# Patient Record
Sex: Female | Born: 1935 | Race: White | Hispanic: No | Smoking: Never smoker
Health system: Southern US, Community
[De-identification: ages and names within clinical notes are randomized; demographics above are authoritative.]

## PROBLEM LIST (undated history)

## (undated) DIAGNOSIS — H539 Unspecified visual disturbance: Secondary | ICD-10-CM

## (undated) DIAGNOSIS — R140 Abdominal distension (gaseous): Secondary | ICD-10-CM

## (undated) DIAGNOSIS — I10 Essential (primary) hypertension: Secondary | ICD-10-CM

## (undated) HISTORY — DX: Abdominal distension (gaseous): R14.0

## (undated) HISTORY — DX: Unspecified visual disturbance: H53.9

## (undated) HISTORY — DX: Essential (primary) hypertension: I10

---
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
## 1998-12-05 ENCOUNTER — Other Ambulatory Visit: Admission: RE | Admit: 1998-12-05 | Discharge: 1998-12-05 | Payer: Self-pay | Admitting: Family Medicine

## 1999-10-31 ENCOUNTER — Other Ambulatory Visit: Admission: RE | Admit: 1999-10-31 | Discharge: 1999-10-31 | Payer: Self-pay | Admitting: Family Medicine

## 2000-10-20 HISTORY — PX: OTHER SURGICAL HISTORY: SHX169

## 2003-10-21 HISTORY — PX: OTHER SURGICAL HISTORY: SHX169

## 2007-02-09 ENCOUNTER — Encounter (INDEPENDENT_AMBULATORY_CARE_PROVIDER_SITE_OTHER): Payer: Self-pay | Admitting: *Deleted

## 2007-02-09 ENCOUNTER — Ambulatory Visit (HOSPITAL_BASED_OUTPATIENT_CLINIC_OR_DEPARTMENT_OTHER): Admission: RE | Admit: 2007-02-09 | Discharge: 2007-02-09 | Payer: Self-pay | Admitting: Surgery

## 2009-01-30 ENCOUNTER — Ambulatory Visit (HOSPITAL_BASED_OUTPATIENT_CLINIC_OR_DEPARTMENT_OTHER): Admission: RE | Admit: 2009-01-30 | Discharge: 2009-01-30 | Payer: Self-pay | Admitting: Family Medicine

## 2009-02-03 ENCOUNTER — Ambulatory Visit: Payer: Self-pay | Admitting: Internal Medicine

## 2009-02-28 ENCOUNTER — Ambulatory Visit (HOSPITAL_BASED_OUTPATIENT_CLINIC_OR_DEPARTMENT_OTHER): Admission: RE | Admit: 2009-02-28 | Discharge: 2009-02-28 | Payer: Self-pay | Admitting: Family Medicine

## 2009-03-03 ENCOUNTER — Ambulatory Visit: Payer: Self-pay | Admitting: Internal Medicine

## 2011-03-07 NOTE — Op Note (Signed)
Alicia Hale, Alicia Hale                 ACCOUNT NO.:  [id number]   MEDICAL RECORD NO.:  [id number]          PATIENT TYPE:  AMB   LOCATION:  [facility]                         FACILITY:  [facility]   PHYSICIAN:  Ardeth Sportsman, MD     DATE OF BIRTH:  Mar 15, 1936   DATE OF PROCEDURE:  02/09/2007  DATE OF DISCHARGE:                               OPERATIVE REPORT   PRIMARY CARE PHYSICIAN:  Windle Guard, M.D.   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   OPERATION PERFORMED:  Wide excision and primary two layered closure of  mass.  Final wound was 3.5 x 7 cm in size.   SURGEON:  Ardeth Sportsman, MD   ASSISTANT:  None.   ANESTHESIA:  1. Monitored deep sedation.  2. 0.25% bupivacaine in a field block.  0.25% bupivacaine with      epinephrine in a field block around the incision.   SPECIMENS:  Midback lesion with squamous cell carcinoma in situ, short  stitch superior, long stitch left lateral.   DRAINS:  None.   ESTIMATED BLOOD LOSS:  Less than 5 mm.   COMPLICATIONS:  None apparent.   INDICATIONS FOR PROCEDURE:  Ms. Franko is a 75 year old female who was  found to have an incidental concerning skin lesion that by punch biopsy  came back as squamous cell carcinoma in situ or Bowen's disease.  Options discussed and recommendation was made for wide excision and  primary closure.  Given the large size, 2 x 2 cm size, recommended this  be done in the operative setting under a more controlled setting and  more careful sedation.  Technique discussed.  Risks such as stroke,  heart attack, deep venous thrombosis, pulmonary embolus and death  discussed.  The risks such as bleeding, need for transfusion, wound  infection, abscess, need for re-excision for positive margins, wound  dehiscence and other  risks were discussed.  Questions answered and she  and her husband agreed to proceed.   OPERATIVE FINDINGS:  The lesion seemed to be well circumscribed in the  dermis only with no evidence of any deep layer involvement.  Felt like I  had good margins.   DESCRIPTION OF PROCEDURE:  Informed consent was confirmed.  The patient  received IV cefazolin just prior to induction.  She had sequential  compression devices active during the entire case.  She was positioned  left side down.  Her back was prepped and draped in sterile fashion.  The area was marked to have about a 2 mm margin on either side and a  field block was placed.  Vertical elliptical incision was made given the  fact that this lesion was right in the midline and there was too much  tension to allow a transverse excision and closure.  I was able to get  around that lesion using scalpel through into the subcutaneous tissues.  Cautery was used to help free the mass off the subcutaneous tissues of  back.  Hemostasis was assured.  Skin flaps were created about 2 cm  laterally on either side & circumferentially.  The wound was closed  using 3-0 Vicryl interrupted deep dermal stitches and 4-0 Monocryl  subcutaneous stitch and full length half inch wide Steri-Strips.  Sterile dressing was applied.  The patient tolerated the procedure well.   Patient was sent to recovery room in stable condition.  I explained the  operative findings to the patient's husband and discussed postoperative  management with both the patient and her husband prior to surgery and  then again with her husband after surgery.  Questions answered and they  expressed understanding and appreciation.   Will have the patient follow up in about seven to 10 days for a final  wound check and follow-up of pathology.      Ardeth Sportsman, MD  Electronically Signed     SCG/MEDQ  D:  02/09/2007  T:  02/09/2007  Job:  [id number]   cc:   Windle Guard, M.D.  Fax:  [phone]

## 2011-03-07 NOTE — Procedures (Signed)
NAME:  Alicia Hale, Alicia Hale                 ACCOUNT NO.:  [id number]   MEDICAL RECORD NO.:  [id number]          PATIENT TYPE:  OUT   LOCATION:  [facility] CENTER                 FACILITY:  [facility]   PHYSICIAN:  Clinton D. Maple Hudson, MD, FCCP, FACPDATE OF BIRTH:  Jan [birth year] 1938   DATE OF STUDY:                            NOCTURNAL POLYSOMNOGRAM   REFERRING PHYSICIAN:  Windle Guard, M.D.   INDICATION FOR STUDY:  Insomnia with sleep apnea.   EPWORTH SLEEPINESS SCORE:  Epworth sleepiness score 4/24, BMI 29.3,  weight 160 pounds, height 62 inches, and neck 12.5 inches.   HOME MEDICATIONS:  Charted and reviewed.   SLEEP ARCHITECTURE:  Total sleep time 334 minutes with sleep efficiency  83.5%.  Stage I was 10.5%, stage II 66.2%, stage III absent, REM 23.3%  of total sleep time.  Sleep latency 12.5 minutes, REM latency 77  minutes, awake after sleep onset 53.5 minutes, arousal index 43.9  indicating increased EEG arousals.  No bedtime medication was taken.   RESPIRATORY DATA:  Apnea/hypopnea index (AHI) 9.5 per hour.  Total of 53  events were counted including 23 obstructive apneas and 30 hypopneas.  Events were more common while supine, but seen in all sleep positions.  REM AHI 9.2 per hour.  An additional 166 respiratory events related to  arousal were counted.  These events did not meet duration and  desaturation criteria for scoring as standard apneas and hypopneas were  seen to result in sleep disturbance and contributed to an overall  respiratory disturbance index (RDI) of 39.3 per hour.  There were  insufficient apneas and hypopneas to qualify for CPAP titration by split  protocol on the study night.   OXYGEN DATA:  Moderate snoring with oxygen desaturation to a nadir of  88%.  Mean oxygen saturation on room air throughout the study was 93.4%.   CARDIAC DATA:  Sinus rhythm with occasional PAC.   MOVEMENTS/PARASOMNIA:  No significant movement disturbance.  Bathroom  x1.   IMPRESSION/RECOMMENDATIONS:  1. Mild-to-moderate obstructive sleep apnea/hypopnea syndrome.  AHI      9.5 per hour.  By water inclusion of mild events with sleep      disturbance, overall respiratory disturbance index was 39.3,      qualifying as moderate obstructive sleep apnea/hypopnea syndrome.      Events were somewhat more common while supine, but nonpositional.      Moderate snoring with oxygen desaturation to a nadir of 88%.  2. There were insufficient full apneas and hypopneas to meet criteria      for CPAP titration by split protocol on the      study night.  Overall, RDI is sufficient to consider return for      CPAP titration if clinically appropriate, otherwise consider      alternative management.      Clinton D. Maple Hudson, MD, [facility], FACP  Diplomate, Biomedical engineer of Sleep Medicine  Electronically Signed     CDY/MEDQ  D:  02/03/2009 11:18:09  T:  02/03/2009 22:23:30  Job:  [id number]

## 2011-03-07 NOTE — Procedures (Signed)
NAME:  Alicia Hale, SCALISI                 ACCOUNT NO.:  [id number]   MEDICAL RECORD NO.:  [id number]          PATIENT TYPE:  OUT   LOCATION:  SLEEP CENTER                 [facility]ACILITY[facility]   PHYSICIAN:  Clinton D. Maple Hudson, MD, FCCP, FACPDATE OF BIRTH:  [birth year]   DATE OF STUDY:                            NOCTURNAL POLYSOMNOGRAM   REFERRING PHYSICIAN:  Windle Guard, M.D.   INDICATION FOR STUDY:  Hypersomnia with sleep apnea.   EPWORTH SLEEPINESS SCORE:  4/24.  BMI 29.3.  Weight 160 pounds.  Height  62 inches.  Neck 12.5 inches.   MEDICATIONS:  Home medications charted and reviewed.  A previous  diagnostic NPSG on January 30, 2009 recorded an AHI of 9.5 an RDI of 39.3  per hour.  CPAP titration was requested.   SLEEP ARCHITECTURE:  Total sleep time 266 minutes with sleep efficiency  66.3%.  Stage I was 3.2%.  Stage II 83.3%.  Stage III absent.  REM 13.5%  of total sleep time.  Sleep latency 22.5 minutes.  REM latency 65  minutes.  Awake after sleep onset 107 minutes.  Arousal index 9.9.  No  bedtime medication was reported.   RESPIRATORY DATA:  CPAP titration protocol.  CPAP was titrated to 11  CWP, AHI 1 per hour.  She wore a small [employer] Respironics FitLife mask  with heated humidifier.   OXYGEN DATA:  Moderate snoring before CPAP control, prevented by CPAP  with mean oxygen saturation on CPAP 95.8% on room air.   CARDIAC DATA:  Normal sinus rhythm.   MOVEMENT-PARASOMNIA:  Frequent limb jerks with a total of 94 counted of  which 30 were associated with arousal or awakening for periodic limb  movement with arousal index of 6.8 per hour.   IMPRESSIONS-RECOMMENDATIONS:  1. Successful CPAP titration to 11 CWP, AHI 1 per hour.  She wore a      small [employer]ips Respironics FitLife mask with heated humidifier.  2. Baseline diagnostic NPSG on January 30, 2009 recorded an AHI of 9.5      and RDI of 39.3 per hour.  3. Periodic limb movement with arousal was noted during CPAP titration      at  an index of 6.8 arousals per hour.  This may be an artifact of      the disturbance associated with CPAP titration.  If limb movement      persists as a      significant cause of sleep disturbance at home after she is      adjusted to CPAP then specific therapy such as Requip or Mirapex      might be considered if appropriate.      Clinton D. Maple Hudson, MD, [facility], FACP  Diplomate, Biomedical engineer of Sleep Medicine  Electronically Signed     CDY/MEDQ  D:  03/03/2009 15:06:49  T:  03/04/2009 07:33:13  Job:  [id number]

## 2011-09-03 ENCOUNTER — Ambulatory Visit (INDEPENDENT_AMBULATORY_CARE_PROVIDER_SITE_OTHER): Payer: Medicare Other | Admitting: Surgery

## 2011-09-03 ENCOUNTER — Encounter (INDEPENDENT_AMBULATORY_CARE_PROVIDER_SITE_OTHER): Payer: Self-pay | Admitting: Surgery

## 2011-09-03 VITALS — BP 118/62 | HR 60 | Temp 98.3°F | Resp 14 | Ht 62.0 in | Wt 163.2 lb

## 2011-09-03 DIAGNOSIS — K8010 Calculus of gallbladder with chronic cholecystitis without obstruction: Secondary | ICD-10-CM

## 2011-09-03 NOTE — Patient Instructions (Signed)
Gallbladder Disease Gallbladder disease (cholecystitis) is an inflammation of your gallbladder. It is usually caused by a build-up of stones (gallstones) or sludge (cholelithiasis) in your gallbladder. The gallbladder is not an essential organ. It is located slightly to the right of center in the belly (abdomen), behind the liver. It stores bile made in the liver. Bile aids in digestion of fats. Gallbladder disease may result in nausea (feeling sick to your stomach), abdominal pain, and jaundice. In severe cases, emergency surgery may be required. The most common type of gallbladder disease is gallstones. They begin as small crystals and slowly grow into stones. Gallstone pain occurs when the bile duct has spasms. The spasms are caused by the stone passing out of the duct. The stone is trying to pass at the same time bile is passing into the small bowel for digestion. The pain usually begins suddenly. It may persist from several minutes to several hours. Infection can occur. Infection can add to discomfort and severity of an acute attack. The pain may be made worse by breathing deeply or by being jarred. There may be fever and tenderness to the touch. In some cases, when gallstones do not move into the bile duct, people have no pain or symptoms. These are called "silent" gallstones. Women are three times more likely to develop gallstones than men. Women who have had several pregnancies are more likely to have gallbladder disease. Physicians sometimes advise removing diseased gallbladders before future pregnancies. Other factors that increase the risk of gallbladder disease are obesity, diets heavy in fried foods and dairy products, increasing age, prolonged use of medications containing female hormones, and heredity. HOME CARE INSTRUCTIONS   If your physician prescribed an antibiotic, take as directed.   Only take over-the-counter or prescription medicines for pain, discomfort, or fever as directed by your  caregiver.   Follow a low fat diet until seen again. (Fat causes the gallbladder to contract.)   Follow-up as instructed. Attacks are almost always recurrent and surgery is usually required for permanent treatment.  SEEK IMMEDIATE MEDICAL CARE IF:   Pain is increasing and not controlled by medications.   The pain moves to another part of your abdomen or to your back. (Right sided pain can be appendicitis and left sided pain in adults can be diverticulitis).   You have a fever.   You develop nausea and vomiting.  Document Released: 10/06/2005 Document Revised: 06/18/2011 Document Reviewed: 05/25/2008 [employer] Patient Information 2012 [employer]. 

## 2011-09-03 NOTE — Progress Notes (Signed)
Subjective:     Patient ID: Alicia Hale, female   DOB: 10-23-1935, 75 y.o.   MRN: [id number]  HPI  Patient Care Team: Kaleen Mask as PCP - General (Family Medicine)  This patient is a 75 y.o.female who presents today for surgical evaluation.   Reason for consult: Abdominal pain and gallstones.  Patient is a pleasant obese female who has a history of chronic epigastric discomfort improved on Nexium. She's never had an endoscopy. She denies true reflux with this. She compresses when she tried to wean herself off and Nexium, she Nu-Knit take a lot of TUMS. She has palpable about every day or so. Never had a colonoscopy. Guaiac cards always been negative.  Her main complaint is after eating a funnel cake, she had an episode of severe upper abdominal pain. She had nausea and vomiting. She had severe bloating. She had never had pain like this before.  It lasted for a few hours and went away. She presented to her primary care physician. Workup showed gallstones. Dr. Jeannetta Nap sent the patient to me for surgical evaluation.  Patient often goes to the mall and can walk there for several hours without difficulty. No history of strokes or heart attacks.  Past Medical History  Diagnosis Date  . Asthma   . Hypertension   . Visual disturbance     due to early stage macular degeneration  . Abdominal distension     Past Surgical History  Procedure Date  . Appendectomy 1942  . Arthroscopic surgery 2005    left knee - torn cartilage  . Pre cancerous growth removal 2002    from mid lower back    History   Social History  . Marital Status: Widowed    Spouse Name: N/A    Number of Children: N/A  . Years of Education: N/A   Occupational History  . Not on file.   Social History Main Topics  . Smoking status: Never Smoker   . Smokeless tobacco: Never Used  . Alcohol Use: Yes     rarely - 2 or 3 times per year  . Drug Use: No  . Sexually Active: Not on file   Other Topics Concern   . Not on file   Social History Narrative  . No narrative on file    Family History  Problem Relation Age of Onset  . Other Mother 93    fluid in lungs  . Stroke Father 64    Current outpatient prescriptions:esomeprazole (NEXIUM) 20 MG capsule, Take 20 mg by mouth daily before breakfast.  , Disp: , Rfl: ;  Multiple Vitamins-Minerals (ONE-A-DAY 50 PLUS PO), Take by mouth daily.  , Disp: , Rfl: ;  Multiple Vitamins-Minerals (PRESERVISION AREDS PO), Take by mouth 2 (two) times daily.  , Disp: , Rfl:  triamterene-hydrochlorothiazide (MAXZIDE) 75-50 MG per tablet, Take 1 tablet by mouth daily. Patient takes 1/2 a tablet daily. , Disp: , Rfl:   Allergies  Allergen Reactions  . Amoxicillin Hives    Hives from the neck down, will turn face red.  . Oxycodone Nausea And Vomiting    Pt had intolerance to narcotic after knee surgery...       Review of Systems  Constitutional: Negative for fever, chills, diaphoresis, appetite change and fatigue.  HENT: Negative for ear pain, sore throat, trouble swallowing, neck pain and ear discharge.   Eyes: Negative for photophobia, discharge and visual disturbance.  Respiratory: Negative for cough, choking, chest tightness and shortness of  breath.   Cardiovascular: Negative for chest pain and palpitations.  Gastrointestinal: Negative for diarrhea, constipation, blood in stool, anal bleeding and rectal pain.       No personal nor family history of GI/colon cancer, inflammatory bowel disease, irritable bowel syndrome, allergy such as Celiac Sprue, dietary/dairy problems, colitis, ulcers nor gastritis.    No recent sick contacts/gastroenteritis.  No travel outside the country.  No changes in diet.    Genitourinary: Negative for dysuria, frequency and difficulty urinating.  Musculoskeletal: Negative for myalgias and gait problem.  Skin: Negative for color change, pallor and rash.  Neurological: Negative for dizziness, speech difficulty, weakness and  numbness.  Hematological: Negative for adenopathy.  Psychiatric/Behavioral: Negative for confusion and agitation. The patient is not nervous/anxious.        Objective:   Physical Exam  Constitutional: She is oriented to person, place, and time. She appears well-developed and well-nourished. No distress.  HENT:  Head: Normocephalic.  Mouth/Throat: Oropharynx is clear and moist. No oropharyngeal exudate.  Eyes: Conjunctivae and EOM are normal. Pupils are equal, round, and reactive to light. No scleral icterus.  Neck: Normal range of motion. Neck supple. No tracheal deviation present.  Cardiovascular: Normal rate, regular rhythm and intact distal pulses.   Pulmonary/Chest: Effort normal and breath sounds normal. No respiratory distress. She exhibits no tenderness.  Abdominal: Soft. She exhibits no distension and no mass. There is no tenderness. Hernia confirmed negative in the right inguinal area and confirmed negative in the left inguinal area.       Obese, RLQ vertical scar c/w appy as child.  No hernias  Genitourinary: No vaginal discharge found.  Musculoskeletal: Normal range of motion. She exhibits no tenderness.  Lymphadenopathy:    She has no cervical adenopathy.       Right: No inguinal adenopathy present.       Left: No inguinal adenopathy present.  Neurological: She is alert and oriented to person, place, and time. No cranial nerve deficit. She exhibits normal muscle tone. Coordination normal.  Skin: Skin is warm and dry. No rash noted. She is not diaphoretic. No erythema.  Psychiatric: She has a normal mood and affect. Her behavior is normal. Judgment and thought content normal.   Studies:  U/S shows gallbladder full of gallstones     Assessment:     Symptomatic gallstones, prob chronic cholecystitis    Plan:     I think it is reasonable to start with a single site approach. If her symptoms do not improve, consider upper and lower endoscopy. Screening colonoscopy per her  primary care physician.  The anatomy & physiology of hepatobiliary & pancreatic function was discussed.  The pathophysiology of gallbladder dysfunction was discussed.  Natural history risks without surgery was discussed.   I feel the risks of no intervention will lead to serious problems that outweigh the operative risks; therefore, I recommended cholecystectomy to remove the pathology.  I explained laparoscopic techniques with possible need for an open approach.  Probable cholangiogram to evaluate the bilary tract was explained as well.    Risks such as bleeding, infection, abscess, leak, injury to other organs, need for further treatment, heart attack, death, and other risks were discussed.  Possibility that this will not correct all abdominal symptoms was explained.  Goals of post-operative recovery were discussed as well.  We will work to minimize complications.  An educational handout further explaining the pathology and treatment options was given as well.  Questions were answered.  The patient expresses  understanding & wishes to proceed with surgery.

## 2011-10-17 ENCOUNTER — Encounter (INDEPENDENT_AMBULATORY_CARE_PROVIDER_SITE_OTHER): Payer: Self-pay | Admitting: Family Medicine

## 2011-11-07 ENCOUNTER — Other Ambulatory Visit (INDEPENDENT_AMBULATORY_CARE_PROVIDER_SITE_OTHER): Payer: Self-pay | Admitting: Surgery

## 2011-11-07 DIAGNOSIS — K8010 Calculus of gallbladder with chronic cholecystitis without obstruction: Secondary | ICD-10-CM

## 2011-11-07 HISTORY — PX: CHOLECYSTECTOMY: SHX55

## 2011-11-08 ENCOUNTER — Telehealth (INDEPENDENT_AMBULATORY_CARE_PROVIDER_SITE_OTHER): Payer: Self-pay | Admitting: General Surgery

## 2011-11-08 NOTE — Telephone Encounter (Signed)
Alicia Hale had a lap chole by Dr. Michaell Cowing on 1/18.  She awoke this AM with a rash on her body.  She has been taking Tylenol for pain.  Her daughter-in-law, Nabila Albarracin, wondered if the rash was from an antibiotic she was given at the time of her surgery and I told her there was a good chance that this was the case.  I instructed her to give Alicia Hale some Benadryl and if the rashed worsened, she had difficulty breathing or swallowing then they should go to the emergency room.

## 2011-12-03 ENCOUNTER — Encounter (INDEPENDENT_AMBULATORY_CARE_PROVIDER_SITE_OTHER): Payer: Self-pay | Admitting: Surgery

## 2011-12-03 ENCOUNTER — Ambulatory Visit (INDEPENDENT_AMBULATORY_CARE_PROVIDER_SITE_OTHER): Payer: Medicare Other | Admitting: Surgery

## 2011-12-03 VITALS — BP 126/80 | HR 70 | Temp 97.9°F | Resp 16 | Ht 62.0 in | Wt 162.8 lb

## 2011-12-03 DIAGNOSIS — K8010 Calculus of gallbladder with chronic cholecystitis without obstruction: Secondary | ICD-10-CM

## 2011-12-03 NOTE — Progress Notes (Signed)
Subjective:     Patient ID: Alicia Hale, female   DOB: 15-May-1936, 76 y.o.   MRN: [id number]  HPI  Alicia Hale  [birth year] [id number]  Patient Care Team: Kaleen Mask, MD as PCP - General (Family Medicine)  This patient is a 76 y.o.female who presents today for surgical evaluation.   Procedure: Single site a laparoscopic cholecystectomy and cholangiogram 11/07/2011  Pathology: Chronic cholecystitis and Colace cystoscopy lithiasis  The patient comes in today feeling well. No more soreness. Wanting to be more active. Mild constipation controlled with the p.r.n. prunes. No fevers or chills. No jaundice. Urine light yellow no dark urine or light stools. Energy level good.  Patient Active Problem List  Diagnoses  . Cholelithiasis with cholecystitis    Past Medical History  Diagnosis Date  . Asthma   . Hypertension   . Visual disturbance     due to early stage macular degeneration  . Abdominal distension     Past Surgical History  Procedure Date  . Appendectomy 1942  . Arthroscopic surgery 2005    left knee - torn cartilage  . Pre cancerous growth removal 2002    from mid lower back  . Cholecystectomy 11/07/11    History   Social History  . Marital Status: Widowed    Spouse Name: N/A    Number of Children: N/A  . Years of Education: N/A   Occupational History  . Not on file.   Social History Main Topics  . Smoking status: Never Smoker   . Smokeless tobacco: Never Used  . Alcohol Use: Yes     rarely - 2 or 3 times per year  . Drug Use: No  . Sexually Active: Not on file   Other Topics Concern  . Not on file   Social History Narrative  . No narrative on file    Family History  Problem Relation Age of Onset  . Other Mother 93    fluid in lungs  . Stroke Father 93    Current outpatient prescriptions:esomeprazole (NEXIUM) 20 MG capsule, Take 20 mg by mouth daily before breakfast.  , Disp: , Rfl: ;  Multiple Vitamins-Minerals (ONE-A-DAY 50 PLUS  PO), Take by mouth daily.  , Disp: , Rfl: ;  Multiple Vitamins-Minerals (PRESERVISION AREDS PO), Take by mouth 2 (two) times daily.  , Disp: , Rfl:  triamterene-hydrochlorothiazide (MAXZIDE) 75-50 MG per tablet, Take 1 tablet by mouth daily. Patient takes 1/2 a tablet daily. , Disp: , Rfl:   Allergies  Allergen Reactions  . Amoxicillin Hives    Hives from the neck down, will turn face red.  . Oxycodone Rash    All over the body    BP 126/80  Pulse 70  Temp(Src) 97.9 F (36.6 C) (Temporal)  Resp 16  Ht 5\' 2"  (1.575 m)  Wt 162 lb 12.8 oz (73.846 kg)  BMI 29.78 kg/m2     Review of Systems  Constitutional: Negative for fever, chills and diaphoresis.  HENT: Negative for ear pain, sore throat and trouble swallowing.   Eyes: Negative for photophobia and visual disturbance.  Respiratory: Negative for cough and choking.   Cardiovascular: Negative for chest pain and palpitations.  Gastrointestinal: Negative for nausea, vomiting, abdominal pain, diarrhea, constipation, anal bleeding and rectal pain.  Genitourinary: Negative for dysuria, frequency and difficulty urinating.  Musculoskeletal: Negative for myalgias and gait problem.  Skin: Negative for color change, pallor and rash.  Neurological: Negative for dizziness, speech difficulty, weakness and numbness.  Hematological: Negative for adenopathy.  Psychiatric/Behavioral: Negative for confusion and agitation. The patient is not nervous/anxious.        Objective:   Physical Exam  Constitutional: She is oriented to person, place, and time. She appears well-developed and well-nourished. No distress.  HENT:  Head: Normocephalic.  Mouth/Throat: Oropharynx is clear and moist. No oropharyngeal exudate.  Eyes: Conjunctivae and EOM are normal. Pupils are equal, round, and reactive to light. No scleral icterus.  Neck: Normal range of motion. No tracheal deviation present.  Cardiovascular: Normal rate and intact distal pulses.     Pulmonary/Chest: Effort normal. No respiratory distress. She exhibits no tenderness.  Abdominal: Soft. She exhibits no distension. There is no tenderness. Hernia confirmed negative in the right inguinal area and confirmed negative in the left inguinal area.       Incisions clean with normal healing ridges.  No hernias  Genitourinary: No vaginal discharge found.  Musculoskeletal: Normal range of motion. She exhibits no tenderness.  Lymphadenopathy:       Right: No inguinal adenopathy present.       Left: No inguinal adenopathy present.  Neurological: She is alert and oriented to person, place, and time. No cranial nerve deficit. She exhibits normal muscle tone. Coordination normal.  Skin: Skin is warm and dry. No rash noted. She is not diaphoretic.  Psychiatric: She has a normal mood and affect. Her behavior is normal.       Assessment:     3 weeks s/p lap chole doing quite well    Plan:     Increase activity as tolerated.  Do not push through pain.  Advanced on diet as tolerated. Bowel regimen to avoid problems.  Return to clinic p.r.n. The patient expressed understanding and appreciation

## 2011-12-19 ENCOUNTER — Encounter (INDEPENDENT_AMBULATORY_CARE_PROVIDER_SITE_OTHER): Payer: Self-pay | Admitting: Surgery

## 2011-12-25 ENCOUNTER — Encounter (INDEPENDENT_AMBULATORY_CARE_PROVIDER_SITE_OTHER): Payer: Self-pay | Admitting: Surgery

## 2012-08-02 ENCOUNTER — Other Ambulatory Visit: Payer: Self-pay | Admitting: Radiology

## 2013-08-18 ENCOUNTER — Other Ambulatory Visit: Payer: Self-pay | Admitting: Family Medicine

## 2013-08-18 ENCOUNTER — Ambulatory Visit
Admission: RE | Admit: 2013-08-18 | Discharge: 2013-08-18 | Disposition: A | Discharge status: Home / Self Care | Payer: Medicare Other | Source: Ambulatory Visit | Attending: Family Medicine | Admitting: Family Medicine

## 2013-08-18 DIAGNOSIS — I2699 Other pulmonary embolism without acute cor pulmonale: Secondary | ICD-10-CM

## 2013-08-18 DIAGNOSIS — R7989 Other specified abnormal findings of blood chemistry: Secondary | ICD-10-CM

## 2013-08-18 MED ORDER — IOHEXOL 350 MG/ML SOLN
125.0000 mL | Freq: Once | INTRAVENOUS | Status: AC | PRN
  Administered 2013-08-18: 125 mL via INTRAVENOUS

## 2013-08-30 ENCOUNTER — Other Ambulatory Visit: Payer: Self-pay | Admitting: Family Medicine

## 2013-08-30 DIAGNOSIS — R590 Localized enlarged lymph nodes: Secondary | ICD-10-CM

## 2013-11-17 ENCOUNTER — Ambulatory Visit
Admission: RE | Admit: 2013-11-17 | Discharge: 2013-11-17 | Disposition: A | Discharge status: Home / Self Care | Payer: Medicare Other | Source: Ambulatory Visit | Attending: Family Medicine | Admitting: Family Medicine

## 2013-11-17 DIAGNOSIS — R590 Localized enlarged lymph nodes: Secondary | ICD-10-CM

## 2013-11-17 MED ORDER — IOHEXOL 300 MG/ML  SOLN
75.0000 mL | Freq: Once | INTRAMUSCULAR | Status: AC | PRN
  Administered 2013-11-17: 75 mL via INTRAVENOUS

## 2016-03-27 ENCOUNTER — Other Ambulatory Visit: Payer: Self-pay | Admitting: Family Medicine

## 2016-03-27 DIAGNOSIS — I739 Peripheral vascular disease, unspecified: Secondary | ICD-10-CM

## 2016-04-04 ENCOUNTER — Ambulatory Visit
Admission: RE | Admit: 2016-04-04 | Discharge: 2016-04-04 | Disposition: A | Discharge status: Home / Self Care | Payer: Medicare Other | Source: Ambulatory Visit | Attending: Family Medicine | Admitting: Family Medicine

## 2016-04-04 DIAGNOSIS — I739 Peripheral vascular disease, unspecified: Secondary | ICD-10-CM

## 2016-04-07 ENCOUNTER — Other Ambulatory Visit: Payer: Self-pay | Admitting: Family Medicine

## 2016-04-07 DIAGNOSIS — M545 Low back pain: Secondary | ICD-10-CM

## 2016-04-16 ENCOUNTER — Ambulatory Visit
Admission: RE | Admit: 2016-04-16 | Discharge: 2016-04-16 | Disposition: A | Discharge status: Home / Self Care | Payer: Medicare Other | Source: Ambulatory Visit | Attending: Family Medicine | Admitting: Family Medicine

## 2016-04-16 DIAGNOSIS — M545 Low back pain: Secondary | ICD-10-CM

## 2017-07-08 IMAGING — MR MR LUMBAR SPINE W/O CM
5 series · 43 of 48 positions shown · non-contrast
Comparison: None.

CLINICAL DATA: Left calf pain, weakness. No bowel or bladder
changes.

EXAM:
MRI LUMBAR SPINE WITHOUT CONTRAST
TECHNIQUE: Multiplanar, multisequence MR imaging of the lumbar spine was
performed. No intravenous contrast was administered.

[Series 3: tirm sag · sagittal · 4.0mm · 0.55mm/px · 6 of 13 slices shown]
[im 1/13]
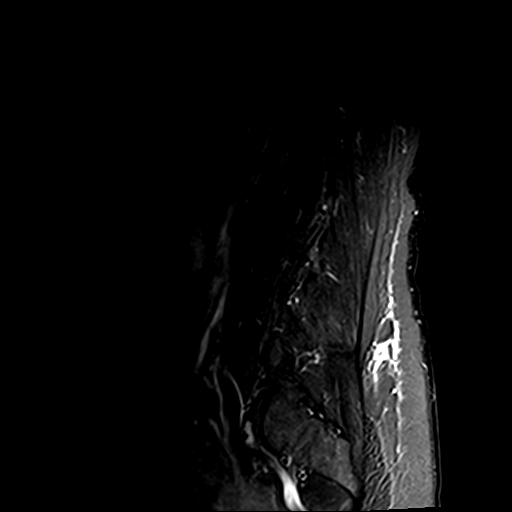
[im 3/13]
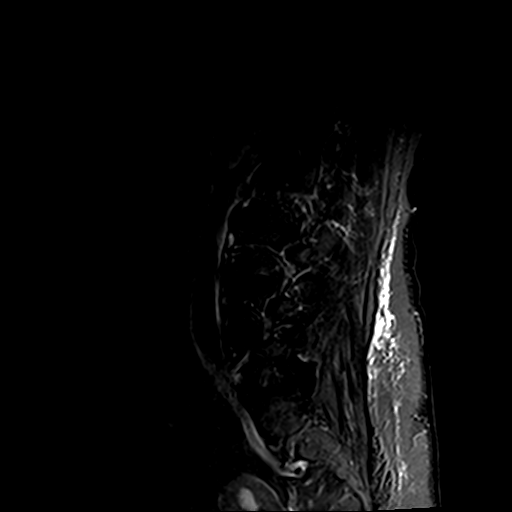
[im 5/13]
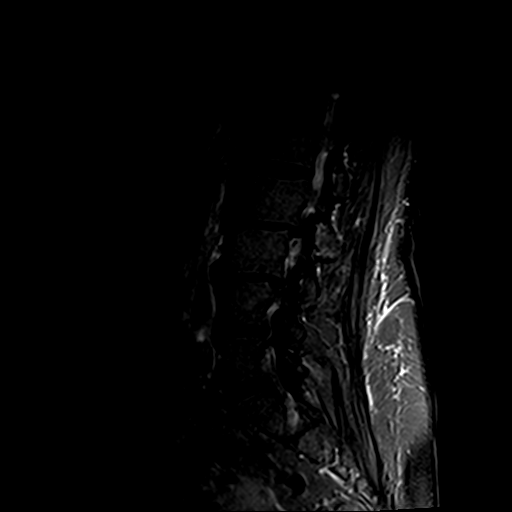
[im 8/13]
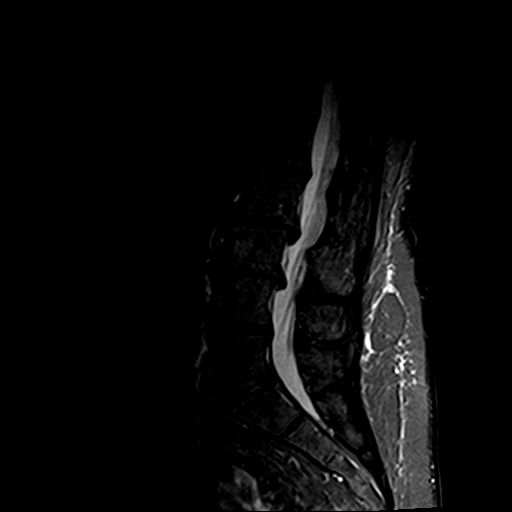
[im 10/13]
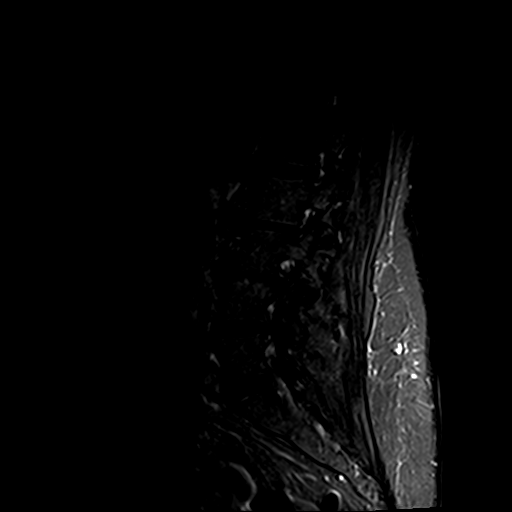
[im 13/13]
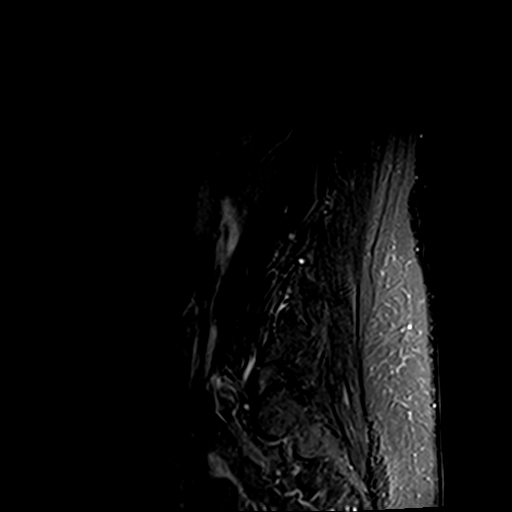

[Series 4: T2 · sagittal · 4.0mm · 0.88mm/px · 6 of 13 slices shown (1 of 2)]
[im 1/13]
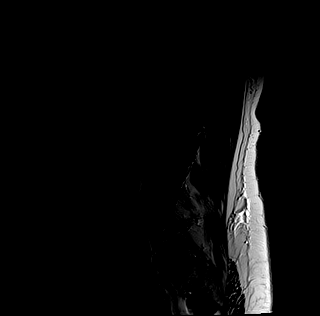
[im 3/13]
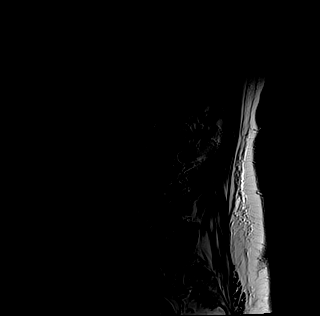
[im 5/13]
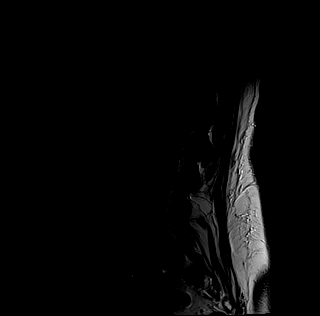
[im 8/13]
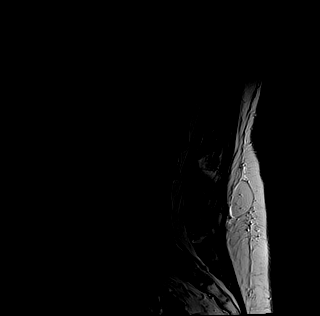
[im 10/13]
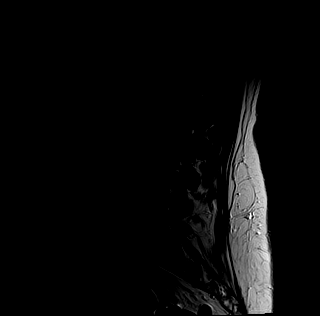
[im 13/13]
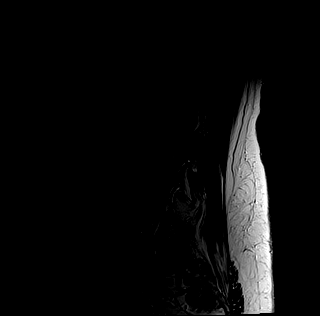

[Series 5: T1 · sagittal · 4.0mm · 0.88mm/px · 6 of 13 slices shown (1 of 2)]
[im 1/13]
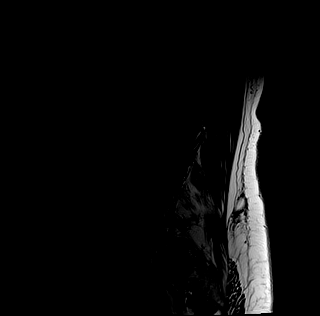
[im 3/13]
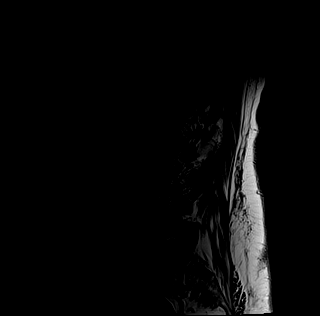
[im 5/13]
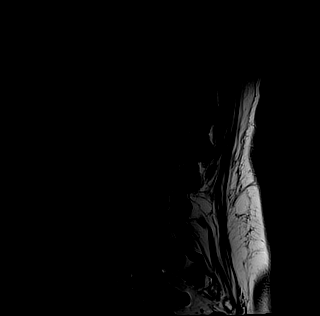
[im 8/13]
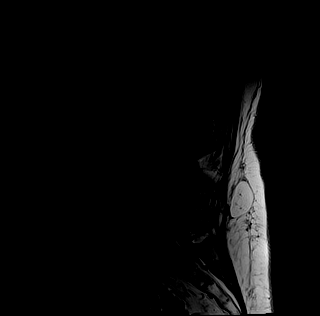
[im 10/13]
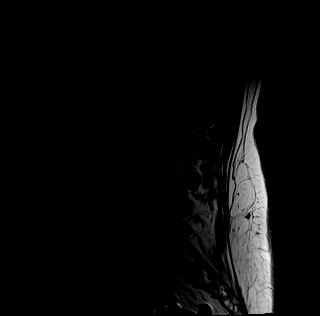
[im 13/13]
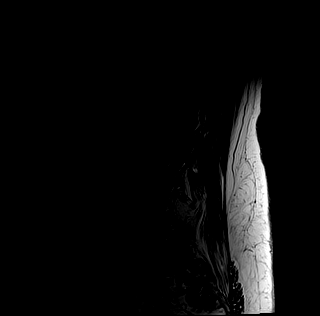

[Series 6: T1 · axial · 4.0mm · 0.70mm/px · z∈[-74,+90]mm · 10 of 30 slices shown (2 of 2)]
[im 1/30]
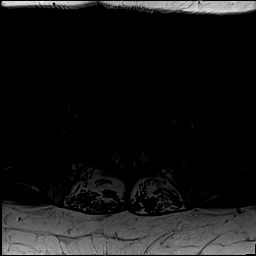
[im 3/30]
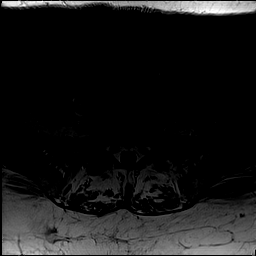
[im 5/30]
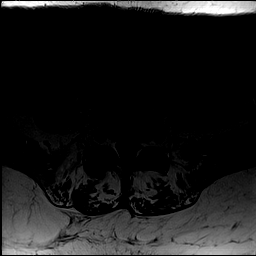
[im 9/30]
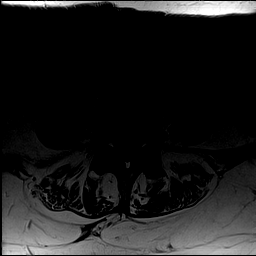
[im 13/30]
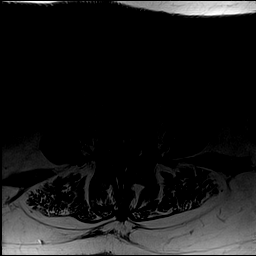
[im 15/30]
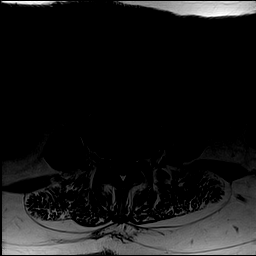
[im 17/30]
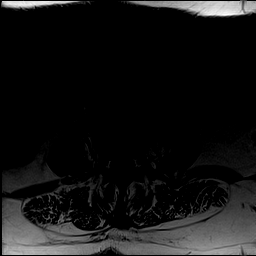
[im 21/30]
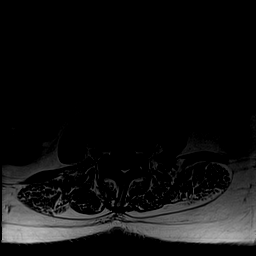
[im 25/30]
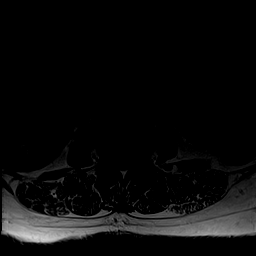
[im 30/30]
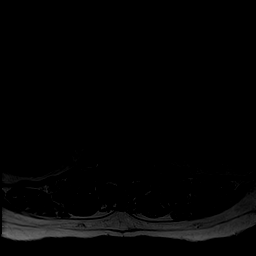

[Series 7: T2 · axial · 4.0mm · 0.70mm/px · z∈[-74,+90]mm · 15 of 30 slices shown (2 of 2)]
[im 1/30]
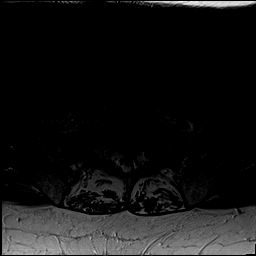
[im 3/30]
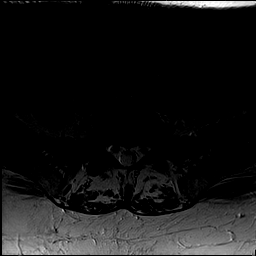
[im 5/30]
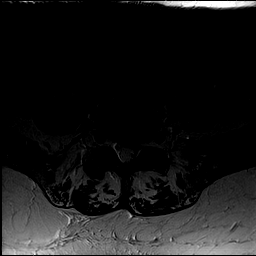
[im 7/30]
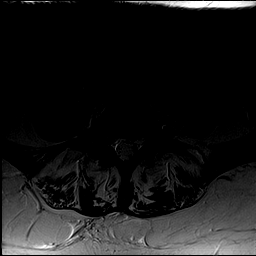
[im 9/30]
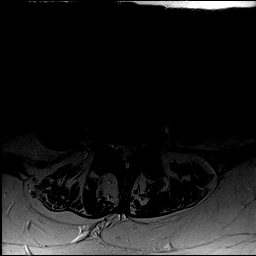
[im 11/30]
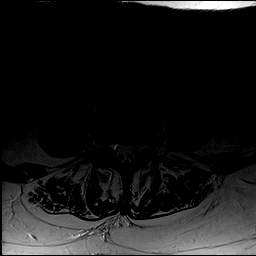
[im 13/30]
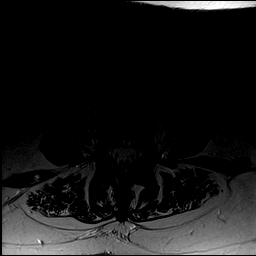
[im 15/30]
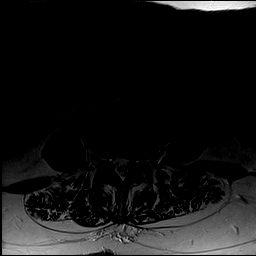
[im 17/30]
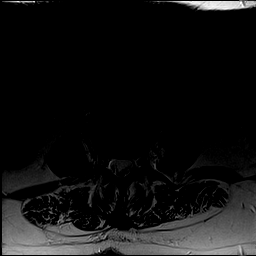
[im 19/30]
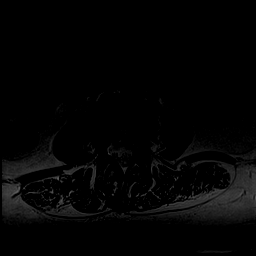
[im 21/30]
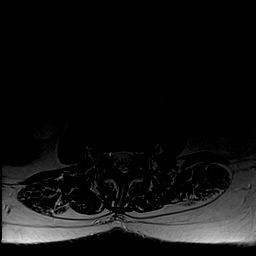
[im 23/30]
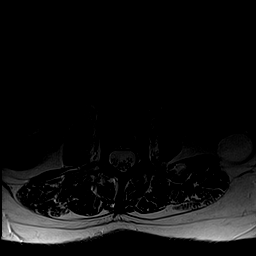
[im 25/30]
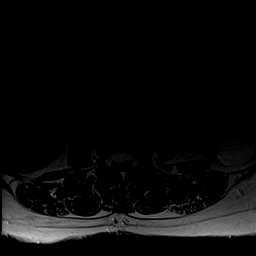
[im 27/30]
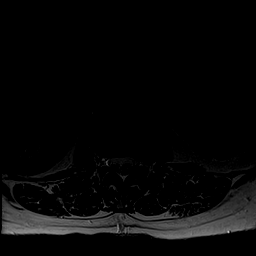
[im 30/30]
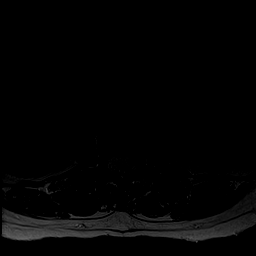

[43 of 48 positions shown; findings below may reference images not displayed]

FINDINGS: Segmentation:  Standard.

Alignment: 6 mm of retrolisthesis of L2 on L3. 3 mm of
retrolisthesis of L3 on L4.

Vertebrae:  No fracture, evidence of discitis, or bone lesion.

Conus medullaris: Extends to the T12-L1 level and appears normal.

Paraspinal and other soft tissues: No paraspinal abnormality. 5 mm
T1 intermediate signal and T2 low signal lesion in the interpolar
aspect of the right kidney likely reflecting a hemorrhagic or
proteinaceous cyst. T2 hyperintense left interpolar renal mass
measuring 2.6 cm consistent with a cyst.

Disc levels:

Disc spaces: Degenerative disc disease with disc height loss at L2-3
and L3-4.

T12-L1: No significant disc bulge. No evidence of neural foraminal
stenosis. No central canal stenosis.

L1-L2: Shallow left subarticular disc protrusion. No evidence of
neural foraminal stenosis. No central canal stenosis.

L2-L3: Mild broad-based disc bulge flattening the ventral thecal
sac. Mild bilateral facet arthropathy. Mild spinal stenosis. Mild
left foraminal narrowing. No right foraminal narrowing.

L3-L4: Mild broad-based disc bulge. Moderate bilateral facet
arthropathy. Mild left lateral recess stenosis. No evidence of
neural foraminal stenosis. No central canal stenosis.

L4-L5: Mild broad-based disc bulge. Mild bilateral facet
arthropathy. No evidence of neural foraminal stenosis. No central
canal stenosis.

L5-S1: No significant disc bulge. No evidence of neural foraminal
stenosis. No central canal stenosis.
IMPRESSION: 1. At L2-3 there is a mild broad-based disc bulge flattening the
ventral thecal sac. Mild bilateral facet arthropathy. Mild spinal
stenosis. Mild left foraminal narrowing.
2. At L3-4 there is a mild broad-based disc bulge and moderate
bilateral facet arthropathy. Mild left lateral recess stenosis.

## 2019-03-21 DEATH — deceased
